# Patient Record
Sex: Female | Born: 1970 | Race: White | Hispanic: No | Marital: Married | Smoking: Never smoker
Health system: Southern US, Community
[De-identification: ages and names within clinical notes are randomized; demographics above are authoritative.]

## PROBLEM LIST (undated history)

## (undated) DIAGNOSIS — F419 Anxiety disorder, unspecified: Secondary | ICD-10-CM

## (undated) DIAGNOSIS — I10 Essential (primary) hypertension: Secondary | ICD-10-CM

## (undated) DIAGNOSIS — R7611 Nonspecific reaction to tuberculin skin test without active tuberculosis: Secondary | ICD-10-CM

## (undated) HISTORY — PX: LAPAROSCOPIC TUBAL LIGATION: SUR803

## (undated) HISTORY — PX: NOVASURE ABLATION: SHX5394

## (undated) HISTORY — DX: Nonspecific reaction to tuberculin skin test without active tuberculosis: R76.11

## (undated) HISTORY — DX: Essential (primary) hypertension: I10

## (undated) HISTORY — DX: Anxiety disorder, unspecified: F41.9

---

## 2004-05-12 ENCOUNTER — Observation Stay: Payer: Self-pay

## 2004-05-16 ENCOUNTER — Inpatient Hospital Stay: Payer: Self-pay | Admitting: Obstetrics and Gynecology

## 2004-05-20 ENCOUNTER — Ambulatory Visit: Payer: Self-pay | Admitting: Unknown Physician Specialty

## 2006-01-25 ENCOUNTER — Ambulatory Visit: Payer: Self-pay | Admitting: Obstetrics and Gynecology

## 2006-02-03 ENCOUNTER — Ambulatory Visit: Payer: Self-pay | Admitting: Obstetrics and Gynecology

## 2006-08-11 ENCOUNTER — Ambulatory Visit: Payer: Self-pay | Admitting: Obstetrics and Gynecology

## 2010-05-17 LAB — HM MAMMOGRAPHY: HM Mammogram: NORMAL

## 2010-05-27 ENCOUNTER — Ambulatory Visit: Payer: Self-pay | Admitting: Obstetrics and Gynecology

## 2011-05-11 LAB — HM PAP SMEAR: HM PAP: NEGATIVE

## 2011-05-20 ENCOUNTER — Other Ambulatory Visit (HOSPITAL_COMMUNITY)
Admission: RE | Admit: 2011-05-20 | Discharge: 2011-05-20 | Disposition: A | Discharge status: Home / Self Care | Payer: BC Managed Care – PPO | Source: Ambulatory Visit | Attending: Internal Medicine | Admitting: Internal Medicine

## 2011-05-20 ENCOUNTER — Ambulatory Visit (INDEPENDENT_AMBULATORY_CARE_PROVIDER_SITE_OTHER): Payer: BC Managed Care – PPO | Admitting: Internal Medicine

## 2011-05-20 ENCOUNTER — Encounter: Payer: Self-pay | Admitting: Internal Medicine

## 2011-05-20 DIAGNOSIS — I10 Essential (primary) hypertension: Secondary | ICD-10-CM | POA: Insufficient documentation

## 2011-05-20 DIAGNOSIS — Z0000 Encounter for general adult medical examination without abnormal findings: Secondary | ICD-10-CM

## 2011-05-20 DIAGNOSIS — L739 Follicular disorder, unspecified: Secondary | ICD-10-CM

## 2011-05-20 DIAGNOSIS — Z01419 Encounter for gynecological examination (general) (routine) without abnormal findings: Secondary | ICD-10-CM | POA: Insufficient documentation

## 2011-05-20 DIAGNOSIS — Z800 Family history of malignant neoplasm of digestive organs: Secondary | ICD-10-CM

## 2011-05-20 DIAGNOSIS — Z1159 Encounter for screening for other viral diseases: Secondary | ICD-10-CM | POA: Insufficient documentation

## 2011-05-20 DIAGNOSIS — L738 Other specified follicular disorders: Secondary | ICD-10-CM

## 2011-05-20 MED ORDER — BISOPROLOL-HYDROCHLOROTHIAZIDE 5-6.25 MG PO TABS: 1.0000 | ORAL_TABLET | Freq: Every day | ORAL | Status: DC

## 2011-05-20 MED ORDER — GENTAMICIN SULFATE 0.1 % EX OINT: TOPICAL_OINTMENT | Freq: Three times a day (TID) | CUTANEOUS | Status: AC

## 2011-05-20 NOTE — Progress Notes (Signed)
Subjective:    Patient ID: Michelle Moses, female    DOB: Apr 29, 1971, 40 y.o.   MRN: [id number]  HPI Michelle Moses is a 40 year old female who presents to establish care. She denies any complaints today. She is interested in losing weight. She reports some dietary indiscretions and a sedentary lifestyle. 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 She has 2 children. She has a history of hypertension for which she takes bisoprolol hydrochlorothiazide. She denies any problems with this medication. She notes that she was also started on Wellbutrin for anxiety. She reports good control of her anxiety with this medication.  She notes a strong family history of colon cancer. Her mother died from colon cancer at age 14. She is interested in having a screening colonoscopy. She notes that she is up-to-date on her other cancer screenings including mammogram which he had last year. She is due for Pap smear.  Outpatient Encounter Prescriptions as of 05/20/2011  Medication Sig Dispense Refill  . bisoprolol-hydrochlorothiazide (ZIAC) 5-6.25 MG per tablet Take 1 tablet by mouth daily.  90 tablet  4  . buPROPion (WELLBUTRIN SR) 150 MG 12 hr tablet Take 1 tablet by mouth Daily.      Marland Kitchen gentamicin (GARAMYCIN) 0.1 % ointment Apply topically 3 (three) times daily.  15 g  0   Review of Systems  Constitutional: Negative for fever, chills, appetite change, fatigue and unexpected weight change.  HENT: Negative for ear pain, congestion, sore throat, trouble swallowing, neck pain, voice change and sinus pressure.   Eyes: Negative for visual disturbance.  Respiratory: Negative for cough, shortness of breath, wheezing and stridor.   Cardiovascular: Negative for chest pain, palpitations and leg swelling.  Gastrointestinal: Negative for nausea, vomiting, abdominal pain, diarrhea, constipation, blood in stool, abdominal distention and anal bleeding.  Genitourinary: Negative for dysuria and flank pain.    Musculoskeletal: Negative for myalgias, arthralgias and gait problem.  Skin: Negative for color change and rash.  Neurological: Negative for dizziness and headaches.  Hematological: Negative for adenopathy. Does not bruise/bleed easily.  Psychiatric/Behavioral: Negative for suicidal ideas, sleep disturbance and dysphoric mood. The patient is not nervous/anxious.    BP 120/80  Pulse 65  Temp(Src) 98.5 F (36.9 C) (Oral)  Resp 20  Ht 5\' 6"  (1.676 m)  Wt 225 lb (102.059 kg)  BMI 36.32 kg/m2  SpO2 98%     Objective:   Physical Exam  Constitutional: She is oriented to person, place, and time. She appears well-developed and well-nourished. No distress.  HENT:  Head: Normocephalic and atraumatic.  Right Ear: External ear normal.  Left Ear: External ear normal.  Nose: Nose normal.  Mouth/Throat: Oropharynx is clear and moist. No oropharyngeal exudate.  Eyes: Conjunctivae are normal. Pupils are equal, round, and reactive to light. Right eye exhibits no discharge. Left eye exhibits no discharge. No scleral icterus.  Neck: Normal range of motion. Neck supple. No tracheal deviation present. No thyromegaly present.  Cardiovascular: Normal rate, regular rhythm, normal heart sounds and intact distal pulses.  Exam reveals no gallop and no friction rub.   No murmur heard. Pulmonary/Chest: Effort normal and breath sounds normal. No respiratory distress. She has no wheezes. She has no rales. She exhibits no tenderness. Right breast exhibits skin change. Right breast exhibits no inverted nipple, no mass and no nipple discharge. Left breast exhibits no inverted nipple, no mass, no nipple discharge and no skin change. Breasts are symmetrical.    Abdominal: Soft. Bowel sounds are normal. She exhibits  no distension and no mass. There is no tenderness. There is no rebound and no guarding.  Genitourinary: Vagina normal and uterus normal. No breast swelling, tenderness, discharge or bleeding. Pelvic exam  was performed with patient prone. There is no rash, tenderness, lesion or injury on the right labia. There is no rash, tenderness, lesion or injury on the left labia. Cervix exhibits no motion tenderness and no friability.  Musculoskeletal: Normal range of motion. She exhibits no edema and no tenderness.  Lymphadenopathy:    She has no cervical adenopathy.  Neurological: She is alert and oriented to person, place, and time. No cranial nerve deficit. She exhibits normal muscle tone. Coordination normal.  Skin: Skin is warm and dry. No rash noted. She is not diaphoretic. No erythema. No pallor.  Psychiatric: She has a normal mood and affect. Her behavior is normal. Judgment and thought content normal.          Assessment & Plan:  1.  Well exam - general exam performed today. Exam was normal. Pap smear is pending. Will check basic blood work including CBC, CMP, lipid profile, and TSH. Will try to set up a screening colonoscopy. We reviewed guidelines on this today and given that her mother died of colon cancer at age 47 she would be due for screening colonoscopy at age 15. She will followup in 6 months.  2. Hypertension -blood pressure well-controlled today. We'll continue bisoprolol hydrochlorothiazide. We'll check renal function with labs next week.  3. Obesity -patient overweight with BMI of 36. We discussed calorie counting and reducing intake to 1200 calories or less per day as well as increasing physical activity with a goal of 30 minutes most days of the week. I gave her some references to use for calorie counting. She will followup in 6 months.  4. Folliculitis - patient noted to have inflamed follicle on right breast. Will try treating with topical gentamicin ointment. If no improvement she will call or return to clinic.

## 2011-05-20 NOTE — Patient Instructions (Signed)
[employer] [facility] [email or web address]

## 2011-05-26 ENCOUNTER — Encounter: Payer: Self-pay | Admitting: Internal Medicine

## 2011-05-27 ENCOUNTER — Other Ambulatory Visit: Payer: BC Managed Care – PPO

## 2011-06-03 ENCOUNTER — Other Ambulatory Visit: Payer: BC Managed Care – PPO

## 2011-06-17 ENCOUNTER — Other Ambulatory Visit (INDEPENDENT_AMBULATORY_CARE_PROVIDER_SITE_OTHER): Payer: BC Managed Care – PPO | Admitting: *Deleted

## 2011-06-17 ENCOUNTER — Encounter: Payer: Self-pay | Admitting: Internal Medicine

## 2011-06-17 DIAGNOSIS — Z0000 Encounter for general adult medical examination without abnormal findings: Secondary | ICD-10-CM

## 2011-06-17 LAB — COMPREHENSIVE METABOLIC PANEL
BUN: 12 mg/dL (ref 6–23)
CO2: 24 mEq/L (ref 19–32)
Calcium: 9 mg/dL (ref 8.4–10.5)
Chloride: 105 mEq/L (ref 96–112)
Creatinine, Ser: 0.7 mg/dL (ref 0.4–1.2)
GFR: 96.58 mL/min (ref >60.00)
Glucose, Bld: 84 mg/dL (ref 70–99)

## 2011-06-17 LAB — CBC WITH DIFFERENTIAL/PLATELET
Basophils Relative: 0.3 % (ref 0.0–3.0)
HCT: 42.9 % (ref 36.0–46.0)
MCV: 85.5 fl (ref 78.0–100.0)
Monocytes Relative: 6.7 % (ref 3.0–12.0)
Neutro Abs: 6.7 10*3/uL (ref 1.4–7.7)
Neutrophils Relative %: 66.5 % (ref 43.0–77.0)
Platelets: 232 10*3/uL (ref 150.0–400.0)
RDW: 12.7 % (ref 11.5–14.6)
WBC: 10 10*3/uL (ref 4.5–10.5)

## 2011-06-17 LAB — LIPID PANEL
HDL: 68.5 mg/dL (ref >39.00)
Triglycerides: 95 mg/dL (ref 0.0–149.0)

## 2011-08-16 ENCOUNTER — Telehealth: Payer: Self-pay | Admitting: *Deleted

## 2011-08-16 NOTE — Telephone Encounter (Signed)
I spoke w/pt - she c/o swelling around her left eye x 3 days. I offered 9 am open apt w/Dr Darrick Huntsman today. She declined b/c she could not get off work and stated she would go to [facility] this afternoon.

## 2011-11-18 ENCOUNTER — Ambulatory Visit: Payer: BC Managed Care – PPO | Admitting: Internal Medicine

## 2012-07-20 ENCOUNTER — Encounter: Payer: BC Managed Care – PPO | Admitting: Internal Medicine

## 2012-07-20 ENCOUNTER — Encounter: Payer: Self-pay | Admitting: Internal Medicine

## 2012-07-20 ENCOUNTER — Ambulatory Visit (INDEPENDENT_AMBULATORY_CARE_PROVIDER_SITE_OTHER): Payer: BC Managed Care – PPO | Admitting: Internal Medicine

## 2012-07-20 VITALS — BP 124/84 | HR 75 | Temp 98.0°F | Resp 16 | Ht 66.0 in | Wt 236.5 lb

## 2012-07-20 DIAGNOSIS — Z0000 Encounter for general adult medical examination without abnormal findings: Secondary | ICD-10-CM | POA: Insufficient documentation

## 2012-07-20 DIAGNOSIS — E669 Obesity, unspecified: Secondary | ICD-10-CM | POA: Insufficient documentation

## 2012-07-20 DIAGNOSIS — I10 Essential (primary) hypertension: Secondary | ICD-10-CM

## 2012-07-20 DIAGNOSIS — Z1331 Encounter for screening for depression: Secondary | ICD-10-CM

## 2012-07-20 DIAGNOSIS — Z23 Encounter for immunization: Secondary | ICD-10-CM

## 2012-07-20 LAB — LIPID PANEL: Cholesterol: 171 mg/dL (ref 0–200)

## 2012-07-20 LAB — COMPREHENSIVE METABOLIC PANEL
Albumin: 3.7 g/dL (ref 3.5–5.2)
BUN: 11 mg/dL (ref 6–23)
Calcium: 9 mg/dL (ref 8.4–10.5)
Chloride: 105 mEq/L (ref 96–112)
Glucose, Bld: 147 mg/dL — ABNORMAL HIGH (ref 70–99)
Potassium: 3.4 mEq/L — ABNORMAL LOW (ref 3.5–5.1)

## 2012-07-20 LAB — CBC WITH DIFFERENTIAL/PLATELET
Basophils Absolute: 0 10*3/uL (ref 0.0–0.1)
Lymphocytes Relative: 23.6 % (ref 12.0–46.0)
Lymphs Abs: 2.9 10*3/uL (ref 0.7–4.0)
Monocytes Relative: 6.1 % (ref 3.0–12.0)
Platelets: 202 10*3/uL (ref 150.0–400.0)
RDW: 13 % (ref 11.5–14.6)

## 2012-07-20 LAB — MICROALBUMIN / CREATININE URINE RATIO: Microalb, Ur: 0.6 mg/dL (ref 0.0–1.9)

## 2012-07-20 LAB — TSH: TSH: 0.37 u[IU]/mL (ref 0.35–5.50)

## 2012-07-20 MED ORDER — BISOPROLOL-HYDROCHLOROTHIAZIDE 5-6.25 MG PO TABS: 1.0000 | ORAL_TABLET | Freq: Every day | ORAL | Status: DC

## 2012-07-20 NOTE — Assessment & Plan Note (Signed)
BMI 38. Encouraged patient to work on weight loss. Encouraged Mediterranean style diet and regular physical activity with goal of 40 minutes of exercise 3 days per week.

## 2012-07-20 NOTE — Assessment & Plan Note (Signed)
Blood pressure well-controlled on current medications. Will check renal function with labs today. Followup in 6 months or sooner as needed.

## 2012-07-20 NOTE — Assessment & Plan Note (Signed)
General medical exam including breast exam is normal today. Pap is deferred as this was normal in 2011. Plan to repeat in 2016. Mammogram ordered today. Patient never followed through with screening colonoscopy which was ordered at last visit. She would prefer to defer colonoscopy until 2014. She will call and she would like to schedule. Flu vaccine given today. Will check labs including CBC, CMP, lipid profile.

## 2012-07-20 NOTE — Progress Notes (Signed)
Subjective:    Patient ID: Michelle Moses, female    DOB: [birth year], 41 y.o.   MRN: [id number]  HPI 41 year old female with history of hypertension and family history of colon cancer presents for annual exam. She reports she is generally doing well. She has no new concerns today. She notes that she has gained weight over the last year. She reports some dietary indiscretion. She does not exercise on a regular basis. In regards to hypertension, she reports compliance with her medication. She denies any headache, palpitations, chest pain. In regards to family history of colon cancer, she reports that she never followed through on colonoscopy which was scheduled after her last visit. She would like to defer until 2014 for screening colonoscopy.  Outpatient Encounter Prescriptions as of 07/20/2012  Medication Sig Dispense Refill  . bisoprolol-hydrochlorothiazide (ZIAC) 5-6.25 MG per tablet Take 1 tablet by mouth daily.  90 tablet  4   BP 124/84  Pulse 75  Temp 98 F (36.7 C) (Oral)  Resp 16  Ht 5\' 6"  (1.676 m)  Wt 236 lb 8 oz (107.276 kg)  BMI 38.17 kg/m2  SpO2 97%  Review of Systems  Constitutional: Negative for fever, chills, appetite change, fatigue and unexpected weight change.  HENT: Negative for ear pain, congestion, sore throat, trouble swallowing, neck pain, voice change and sinus pressure.   Eyes: Negative for visual disturbance.  Respiratory: Negative for cough, shortness of breath, wheezing and stridor.   Cardiovascular: Negative for chest pain, palpitations and leg swelling.  Gastrointestinal: Negative for nausea, vomiting, abdominal pain, diarrhea, constipation, blood in stool, abdominal distention and anal bleeding.  Genitourinary: Negative for dysuria and flank pain.  Musculoskeletal: Negative for myalgias, arthralgias and gait problem.  Skin: Negative for color change and rash.  Neurological: Negative for dizziness and headaches.  Hematological: Negative for adenopathy. Does  not bruise/bleed easily.  Psychiatric/Behavioral: Negative for suicidal ideas, sleep disturbance and dysphoric mood. The patient is not nervous/anxious.        Objective:   Physical Exam  Constitutional: She is oriented to person, place, and time. She appears well-developed and well-nourished. No distress.  HENT:  Head: Normocephalic and atraumatic.  Right Ear: External ear normal.  Left Ear: External ear normal.  Nose: Nose normal.  Mouth/Throat: Oropharynx is clear and moist. No oropharyngeal exudate.  Eyes: Conjunctivae normal are normal. Pupils are equal, round, and reactive to light. Right eye exhibits no discharge. Left eye exhibits no discharge. No scleral icterus.  Neck: Normal range of motion. Neck supple. No tracheal deviation present. No thyromegaly present.  Cardiovascular: Normal rate, regular rhythm, normal heart sounds and intact distal pulses.  Exam reveals no gallop and no friction rub.   No murmur heard. Pulmonary/Chest: Effort normal and breath sounds normal. No accessory muscle usage. Not tachypneic. No respiratory distress. She has no decreased breath sounds. She has no wheezes. She has no rhonchi. She has no rales. She exhibits no tenderness. Right breast exhibits no inverted nipple, no mass, no nipple discharge, no skin change and no tenderness. Left breast exhibits no inverted nipple, no mass, no nipple discharge, no skin change and no tenderness. Breasts are symmetrical.  Abdominal: Soft. Bowel sounds are normal. She exhibits no distension. There is no tenderness. There is no rebound.  Musculoskeletal: Normal range of motion. She exhibits no edema and no tenderness.  Lymphadenopathy:    She has no cervical adenopathy.  Neurological: She is alert and oriented to person, place, and time. No cranial nerve deficit. She  exhibits normal muscle tone. Coordination normal.  Skin: Skin is warm and dry. No rash noted. She is not diaphoretic. No erythema. No pallor.   Psychiatric: She has a normal mood and affect. Her behavior is normal. Judgment and thought content normal.          Assessment & Plan:

## 2012-07-21 LAB — VITAMIN D 25 HYDROXY (VIT D DEFICIENCY, FRACTURES): Vit D, 25-Hydroxy: 22 ng/mL — ABNORMAL LOW (ref 30–89)

## 2012-08-21 ENCOUNTER — Ambulatory Visit: Payer: Self-pay | Admitting: Internal Medicine

## 2012-08-31 ENCOUNTER — Encounter: Payer: Self-pay | Admitting: Internal Medicine

## 2013-03-29 ENCOUNTER — Ambulatory Visit: Payer: BC Managed Care – PPO | Admitting: Internal Medicine

## 2013-09-12 ENCOUNTER — Telehealth: Payer: Self-pay | Admitting: Internal Medicine

## 2013-09-12 DIAGNOSIS — I10 Essential (primary) hypertension: Secondary | ICD-10-CM

## 2013-09-12 NOTE — Telephone Encounter (Signed)
Pt called to schedule physical.  Needed a Friday, scheduled 4/3.  States she will run out of her BP medication before then.  Asking if she can get about a 30 day refill to get her through to her appt.

## 2013-09-13 MED ORDER — BISOPROLOL-HYDROCHLOROTHIAZIDE 5-6.25 MG PO TABS: 1.0000 | ORAL_TABLET | Freq: Every day | ORAL | Status: DC

## 2013-09-13 NOTE — Telephone Encounter (Signed)
Patient was last seen on 07/20/2012 for physical, cancelled her appointment on 03/29/2013. Please advise for refills.

## 2013-09-13 NOTE — Telephone Encounter (Signed)
Prescription sent to pharmacy.

## 2013-09-13 NOTE — Telephone Encounter (Signed)
We can call in 1 month supply, then needs to keep appointment.

## 2013-11-01 ENCOUNTER — Encounter: Payer: BC Managed Care – PPO | Admitting: Internal Medicine

## 2013-11-08 ENCOUNTER — Ambulatory Visit (INDEPENDENT_AMBULATORY_CARE_PROVIDER_SITE_OTHER): Payer: BC Managed Care – PPO | Admitting: Internal Medicine

## 2013-11-08 ENCOUNTER — Encounter: Payer: Self-pay | Admitting: Internal Medicine

## 2013-11-08 VITALS — BP 120/80 | HR 77 | Temp 97.7°F | Ht 65.5 in | Wt 241.0 lb

## 2013-11-08 DIAGNOSIS — Z800 Family history of malignant neoplasm of digestive organs: Secondary | ICD-10-CM | POA: Insufficient documentation

## 2013-11-08 DIAGNOSIS — Z1211 Encounter for screening for malignant neoplasm of colon: Secondary | ICD-10-CM

## 2013-11-08 DIAGNOSIS — Z0000 Encounter for general adult medical examination without abnormal findings: Secondary | ICD-10-CM

## 2013-11-08 DIAGNOSIS — Z1239 Encounter for other screening for malignant neoplasm of breast: Secondary | ICD-10-CM | POA: Insufficient documentation

## 2013-11-08 DIAGNOSIS — I10 Essential (primary) hypertension: Secondary | ICD-10-CM

## 2013-11-08 LAB — COMPREHENSIVE METABOLIC PANEL
ALBUMIN: 4 g/dL (ref 3.5–5.2)
ALT: 15 U/L (ref 0–35)
AST: 22 U/L (ref 0–37)
Alkaline Phosphatase: 87 U/L (ref 39–117)
BUN: 8 mg/dL (ref 6–23)
CALCIUM: 9.4 mg/dL (ref 8.4–10.5)
CHLORIDE: 100 meq/L (ref 96–112)
CO2: 28 meq/L (ref 19–32)
CREATININE: 0.7 mg/dL (ref 0.4–1.2)
GFR: 98.66 mL/min (ref >60.00)
Glucose, Bld: 107 mg/dL — ABNORMAL HIGH (ref 70–99)
POTASSIUM: 3.6 meq/L (ref 3.5–5.1)
Sodium: 137 mEq/L (ref 135–145)
Total Bilirubin: 0.7 mg/dL (ref 0.3–1.2)
Total Protein: 7.4 g/dL (ref 6.0–8.3)

## 2013-11-08 LAB — CBC WITH DIFFERENTIAL/PLATELET
BASOS ABS: 0.1 10*3/uL (ref 0.0–0.1)
BASOS PCT: 0.6 % (ref 0.0–3.0)
Eosinophils Absolute: 0.1 10*3/uL (ref 0.0–0.7)
Eosinophils Relative: 1.1 % (ref 0.0–5.0)
HCT: 40.4 % (ref 36.0–46.0)
HEMOGLOBIN: 13.6 g/dL (ref 12.0–15.0)
LYMPHS PCT: 28.2 % (ref 12.0–46.0)
Lymphs Abs: 3.2 10*3/uL (ref 0.7–4.0)
MCHC: 33.7 g/dL (ref 30.0–36.0)
MCV: 84 fl (ref 78.0–100.0)
MONOS PCT: 5.6 % (ref 3.0–12.0)
Monocytes Absolute: 0.6 10*3/uL (ref 0.1–1.0)
NEUTROS ABS: 7.3 10*3/uL (ref 1.4–7.7)
Neutrophils Relative %: 64.5 % (ref 43.0–77.0)
Platelets: 259 10*3/uL (ref 150.0–400.0)
RBC: 4.8 Mil/uL (ref 3.87–5.11)
RDW: 13.1 % (ref 11.5–14.6)
WBC: 11.3 10*3/uL — AB (ref 4.5–10.5)

## 2013-11-08 LAB — MICROALBUMIN / CREATININE URINE RATIO
Creatinine,U: 105.9 mg/dL
MICROALB UR: 1.2 mg/dL (ref 0.0–1.9)
MICROALB/CREAT RATIO: 1.1 mg/g (ref 0.0–30.0)

## 2013-11-08 LAB — LIPID PANEL
Cholesterol: 157 mg/dL (ref 0–200)
HDL: 56.7 mg/dL (ref >39.00)
LDL Cholesterol: 76 mg/dL (ref 0–99)
Total CHOL/HDL Ratio: 3
Triglycerides: 122 mg/dL (ref 0.0–149.0)
VLDL: 24.4 mg/dL (ref 0.0–40.0)

## 2013-11-08 MED ORDER — BISOPROLOL-HYDROCHLOROTHIAZIDE 5-6.25 MG PO TABS: 1.0000 | ORAL_TABLET | Freq: Every day | ORAL | Status: DC

## 2013-11-08 NOTE — Assessment & Plan Note (Signed)
BP Readings from Last 3 Encounters:  11/08/13 120/80  07/20/12 124/84  05/20/11 120/80   BP well controlled on current medications. Will check renal function with labs today.

## 2013-11-08 NOTE — Assessment & Plan Note (Signed)
Mammogram ordered

## 2013-11-08 NOTE — Assessment & Plan Note (Signed)
Colonoscopy ordered.

## 2013-11-08 NOTE — Assessment & Plan Note (Signed)
General medical exam normal today including breast exam. PAP and pelvic deferred per pt preference. Last PAP 05/2011 normal, HPV neg. Plan repeat PAP 2016. Will check labs including CBC, CMP, lipids. Mammogram ordered. Colonoscopy ordered given family h/o colon cancer. Follow up 6-12 months and prn.

## 2013-11-08 NOTE — Progress Notes (Signed)
Subjective:    Patient ID: Michelle Moses, female    DOB: 12-22-1970, 43 y.o.   MRN: [id number]  HPI 43YO female presents for annual exam. Doing well. No concerns today. Compliant with meds. Has not yet set up colonoscopy. Last mammogram 2014. Last PAP 05/2011 normal, HPV neg. Trying to follow healthier diet and planning to increase exercise.  Review of Systems  Constitutional: Negative for fever, chills, appetite change, fatigue and unexpected weight change.  HENT: Negative for congestion, ear pain, sinus pressure, sore throat, trouble swallowing and voice change.   Eyes: Negative for visual disturbance.  Respiratory: Negative for cough, shortness of breath, wheezing and stridor.   Cardiovascular: Negative for chest pain, palpitations and leg swelling.  Gastrointestinal: Negative for nausea, vomiting, abdominal pain, diarrhea, constipation, blood in stool, abdominal distention and anal bleeding.  Genitourinary: Negative for dysuria and flank pain.  Musculoskeletal: Negative for arthralgias, gait problem, myalgias and neck pain.  Skin: Negative for color change and rash.  Neurological: Negative for dizziness and headaches.  Hematological: Negative for adenopathy. Does not bruise/bleed easily.  Psychiatric/Behavioral: Negative for suicidal ideas, sleep disturbance and dysphoric mood. The patient is not nervous/anxious.        Objective:    BP 120/80  Pulse 77  Temp(Src) 97.7 F (36.5 C) (Oral)  Ht 5' 5.5" (1.664 m)  Wt 241 lb (109.317 kg)  BMI 39.48 kg/m2  SpO2 97% Physical Exam  Constitutional: She is oriented to person, place, and time. She appears well-developed and well-nourished. No distress.  HENT:  Head: Normocephalic and atraumatic.  Right Ear: External ear normal.  Left Ear: External ear normal.  Nose: Nose normal.  Mouth/Throat: Oropharynx is clear and moist. No oropharyngeal exudate.  Eyes: Conjunctivae are normal. Pupils are equal, round, and reactive to light.  Right eye exhibits no discharge. Left eye exhibits no discharge. No scleral icterus.  Neck: Normal range of motion. Neck supple. No tracheal deviation present. No thyromegaly present.  Cardiovascular: Normal rate, regular rhythm, normal heart sounds and intact distal pulses.  Exam reveals no gallop and no friction rub.   No murmur heard. Pulmonary/Chest: Effort normal and breath sounds normal. No accessory muscle usage. Not tachypneic. No respiratory distress. She has no decreased breath sounds. She has no wheezes. She has no rhonchi. She has no rales. She exhibits no tenderness. Right breast exhibits no inverted nipple, no mass, no nipple discharge, no skin change and no tenderness. Left breast exhibits no inverted nipple, no mass, no nipple discharge, no skin change and no tenderness. Breasts are symmetrical.  Abdominal: Soft. Bowel sounds are normal. She exhibits no distension and no mass. There is no tenderness. There is no rebound and no guarding.  Musculoskeletal: Normal range of motion. She exhibits no edema and no tenderness.  Lymphadenopathy:    She has no cervical adenopathy.  Neurological: She is alert and oriented to person, place, and time. No cranial nerve deficit. She exhibits normal muscle tone. Coordination normal.  Skin: Skin is warm and dry. No rash noted. She is not diaphoretic. No erythema. No pallor.  Psychiatric: She has a normal mood and affect. Her behavior is normal. Judgment and thought content normal.          Assessment & Plan:   Problem List Items Addressed This Visit   Family hx of colon cancer     Colonoscopy ordered.    Relevant Orders      Ambulatory referral to Colorectal Surgery   Hypertension  BP Readings from Last 3 Encounters:  11/08/13 120/80  07/20/12 124/84  05/20/11 120/80   BP well controlled on current medications. Will check renal function with labs today.    Relevant Medications      bisoprolol-hydrochlorothiazide (ZIAC) 5-6.25 MG  per tablet   Routine general medical examination at a health care facility - Primary     General medical exam normal today including breast exam. PAP and pelvic deferred per pt preference. Last PAP 05/2011 normal, HPV neg. Plan repeat PAP 2016. Will check labs including CBC, CMP, lipids. Mammogram ordered. Colonoscopy ordered given family h/o colon cancer. Follow up 6-12 months and prn.    Relevant Orders      CBC with Differential      Comprehensive metabolic panel      Lipid panel      Microalbumin / creatinine urine ratio      Vit D  25 hydroxy (rtn osteoporosis monitoring)   Screening for breast cancer     Mammogram ordered.    Relevant Orders      MM Digital Screening   Special screening for malignant neoplasms, colon     Colonoscopy ordered.    Relevant Orders      Ambulatory referral to Colorectal Surgery       Return in about 1 year (around 11/09/2014) for Physical.

## 2013-11-09 LAB — VITAMIN D 25 HYDROXY (VIT D DEFICIENCY, FRACTURES): VIT D 25 HYDROXY: 24 ng/mL — AB (ref 30–89)

## 2013-11-10 ENCOUNTER — Encounter: Payer: Self-pay | Admitting: *Deleted

## 2013-11-11 ENCOUNTER — Encounter: Payer: Self-pay | Admitting: *Deleted

## 2013-11-11 ENCOUNTER — Telehealth: Payer: Self-pay | Admitting: Internal Medicine

## 2013-11-11 NOTE — Telephone Encounter (Signed)
Relevant patient education assigned to patient using Emmi. ° °

## 2013-11-12 ENCOUNTER — Encounter: Payer: Self-pay | Admitting: Emergency Medicine

## 2013-11-29 ENCOUNTER — Ambulatory Visit: Payer: Self-pay | Admitting: Internal Medicine

## 2013-11-29 LAB — HM MAMMOGRAPHY: HM Mammogram: NEGATIVE

## 2013-12-03 ENCOUNTER — Encounter: Payer: Self-pay | Admitting: *Deleted

## 2014-02-12 LAB — HM COLONOSCOPY

## 2014-04-04 ENCOUNTER — Ambulatory Visit (INDEPENDENT_AMBULATORY_CARE_PROVIDER_SITE_OTHER): Payer: BC Managed Care – PPO | Admitting: Internal Medicine

## 2014-04-04 ENCOUNTER — Ambulatory Visit: Payer: Self-pay | Admitting: Internal Medicine

## 2014-04-04 ENCOUNTER — Encounter: Payer: Self-pay | Admitting: Internal Medicine

## 2014-04-04 VITALS — BP 126/80 | HR 56 | Temp 97.8°F | Ht 65.5 in

## 2014-04-04 DIAGNOSIS — Z0000 Encounter for general adult medical examination without abnormal findings: Secondary | ICD-10-CM

## 2014-04-04 DIAGNOSIS — R109 Unspecified abdominal pain: Secondary | ICD-10-CM

## 2014-04-04 LAB — POCT URINALYSIS DIPSTICK
BILIRUBIN UA: NEGATIVE
Glucose, UA: NEGATIVE
KETONES UA: NEGATIVE
Leukocytes, UA: NEGATIVE
Nitrite, UA: NEGATIVE
Protein, UA: NEGATIVE
Urobilinogen, UA: 0.2
pH, UA: 6.5

## 2014-04-04 MED ORDER — HYDROCODONE-ACETAMINOPHEN 5-325 MG PO TABS: 1.0000 | ORAL_TABLET | Freq: Three times a day (TID) | ORAL | Status: DC | PRN

## 2014-04-04 NOTE — Progress Notes (Signed)
   Subjective:    Patient ID: Michelle Moses, female    DOB: 1970/09/22, 43 y.o.   MRN: [id number]  HPI 43YO female presents for acute visit.  Abdominal pain - Tuesday night developed severe left lateral abdominal pain. Woke from sleep. Pain comes and goes. No NVD. No change in appetite. No urinary symptoms. No fever, chills. No trauma. No h/o kidney stone. Taking Motrin with some improvement in pain symptoms.  Review of Systems  Constitutional: Negative for fever, chills, appetite change, fatigue and unexpected weight change.  Eyes: Negative for visual disturbance.  Respiratory: Negative for shortness of breath.   Cardiovascular: Negative for chest pain and leg swelling.  Gastrointestinal: Positive for abdominal pain. Negative for nausea, vomiting, diarrhea, constipation and blood in stool.  Genitourinary: Positive for flank pain. Negative for dysuria, urgency, frequency, hematuria, decreased urine volume, vaginal bleeding and vaginal discharge.  Skin: Negative for color change and rash.  Hematological: Negative for adenopathy. Does not bruise/bleed easily.  Psychiatric/Behavioral: Negative for dysphoric mood. The patient is not nervous/anxious.        Objective:    BP 126/80  Pulse 56  Temp(Src) 97.8 F (36.6 C) (Oral)  Ht 5' 5.5" (1.664 m)  SpO2 98% Physical Exam  Constitutional: She is oriented to person, place, and time. She appears well-developed and well-nourished. No distress.  HENT:  Head: Normocephalic and atraumatic.  Right Ear: External ear normal.  Left Ear: External ear normal.  Nose: Nose normal.  Mouth/Throat: Oropharynx is clear and moist.  Eyes: Conjunctivae are normal. Pupils are equal, round, and reactive to light. Right eye exhibits no discharge. Left eye exhibits no discharge. No scleral icterus.  Neck: Normal range of motion. Neck supple. No tracheal deviation present. No thyromegaly present.  Pulmonary/Chest: Effort normal. No accessory muscle usage. Not  tachypneic. She has no decreased breath sounds. She has no rhonchi.  Abdominal: There is tenderness (left flank tenderness).  Musculoskeletal: Normal range of motion. She exhibits no edema and no tenderness.  Lymphadenopathy:    She has no cervical adenopathy.  Neurological: She is alert and oriented to person, place, and time. No cranial nerve deficit. She exhibits normal muscle tone. Coordination normal.  Skin: Skin is warm and dry. No rash noted. She is not diaphoretic. No erythema. No pallor.  Psychiatric: She has a normal mood and affect. Her behavior is normal. Judgment and thought content normal.          Assessment & Plan:   Problem List Items Addressed This Visit     Unprioritized   Flank pain - Primary     Left flank pain. UA pos for blood. Will get CT abdomen to evaluate for nephrolithiasis.    Relevant Medications      HYDROcodone-acetaminophen (NORCO/VICODIN) 5-325 MG per tablet   Other Relevant Orders      POCT Urinalysis Dipstick (Completed)      CT Abdomen Pelvis Wo Contrast       Return in about 1 week (around 04/11/2014), or if symptoms worsen or fail to improve.

## 2014-04-04 NOTE — Assessment & Plan Note (Signed)
Left flank pain. UA pos for blood. Will get CT abdomen to evaluate for nephrolithiasis.

## 2014-04-04 NOTE — Addendum Note (Signed)
Addended by: Cristela Blue on: 04/04/2014 10:54 AM   Modules accepted: Orders

## 2014-04-04 NOTE — Patient Instructions (Signed)
CT abdomen today.  Increase fluid intake as much as possible.  Use Motrin and Hydrocodone as needed for pain.

## 2014-04-04 NOTE — Progress Notes (Signed)
Pre visit review using our clinic review tool, if applicable. No additional management support is needed unless otherwise documented below in the visit note. 

## 2014-04-08 ENCOUNTER — Telehealth: Payer: Self-pay | Admitting: Internal Medicine

## 2014-04-08 LAB — CULTURE, URINE COMPREHENSIVE

## 2014-04-08 NOTE — Telephone Encounter (Signed)
CT abdomen was normal with no kidney stone. Is she still having pain?

## 2014-04-09 ENCOUNTER — Other Ambulatory Visit: Payer: Self-pay | Admitting: *Deleted

## 2014-04-09 MED ORDER — CIPROFLOXACIN HCL 500 MG PO TABS: 500.0000 mg | ORAL_TABLET | Freq: Two times a day (BID) | ORAL | Status: DC

## 2014-04-11 ENCOUNTER — Ambulatory Visit: Payer: BC Managed Care – PPO | Admitting: Internal Medicine

## 2014-04-11 NOTE — Telephone Encounter (Signed)
Notified pt, Pt states no pain, antibiotic is working well

## 2014-05-14 ENCOUNTER — Encounter: Payer: Self-pay | Admitting: Internal Medicine

## 2014-11-14 ENCOUNTER — Encounter: Payer: Self-pay | Admitting: Internal Medicine

## 2014-11-14 ENCOUNTER — Ambulatory Visit (INDEPENDENT_AMBULATORY_CARE_PROVIDER_SITE_OTHER): Payer: BC Managed Care – PPO | Admitting: Internal Medicine

## 2014-11-14 VITALS — BP 115/69 | HR 51 | Temp 97.4°F | Ht 66.0 in | Wt 244.0 lb

## 2014-11-14 DIAGNOSIS — E669 Obesity, unspecified: Secondary | ICD-10-CM

## 2014-11-14 DIAGNOSIS — I10 Essential (primary) hypertension: Secondary | ICD-10-CM | POA: Diagnosis not present

## 2014-11-14 DIAGNOSIS — Z0000 Encounter for general adult medical examination without abnormal findings: Secondary | ICD-10-CM

## 2014-11-14 LAB — COMPREHENSIVE METABOLIC PANEL WITH GFR
ALT: 10 U/L (ref 0–35)
AST: 15 U/L (ref 0–37)
Albumin: 4.1 g/dL (ref 3.5–5.2)
Alkaline Phosphatase: 103 U/L (ref 39–117)
BUN: 11 mg/dL (ref 6–23)
CO2: 27 meq/L (ref 19–32)
Calcium: 9.6 mg/dL (ref 8.4–10.5)
Chloride: 102 meq/L (ref 96–112)
Creatinine, Ser: 0.66 mg/dL (ref 0.40–1.20)
GFR: 103.37 mL/min
Glucose, Bld: 116 mg/dL — ABNORMAL HIGH (ref 70–99)
Potassium: 4 meq/L (ref 3.5–5.1)
Sodium: 136 meq/L (ref 135–145)
Total Bilirubin: 0.5 mg/dL (ref 0.2–1.2)
Total Protein: 7.5 g/dL (ref 6.0–8.3)

## 2014-11-14 LAB — POCT URINALYSIS DIPSTICK
BILIRUBIN UA: NEGATIVE
Glucose, UA: NEGATIVE
KETONES UA: NEGATIVE
LEUKOCYTES UA: NEGATIVE
Nitrite, UA: NEGATIVE
Protein, UA: NEGATIVE
Urobilinogen, UA: 0.2
pH, UA: 6

## 2014-11-14 LAB — CBC WITH DIFFERENTIAL/PLATELET
BASOS PCT: 0.4 % (ref 0.0–3.0)
Basophils Absolute: 0 10*3/uL (ref 0.0–0.1)
EOS ABS: 0.1 10*3/uL (ref 0.0–0.7)
EOS PCT: 1.4 % (ref 0.0–5.0)
HCT: 42.3 % (ref 36.0–46.0)
Hemoglobin: 14.3 g/dL (ref 12.0–15.0)
Lymphocytes Relative: 25.2 % (ref 12.0–46.0)
Lymphs Abs: 2.5 10*3/uL (ref 0.7–4.0)
MCHC: 33.8 g/dL (ref 30.0–36.0)
MCV: 82.1 fl (ref 78.0–100.0)
Monocytes Absolute: 0.7 10*3/uL (ref 0.1–1.0)
Monocytes Relative: 6.6 % (ref 3.0–12.0)
NEUTROS PCT: 66.4 % (ref 43.0–77.0)
Neutro Abs: 6.7 10*3/uL (ref 1.4–7.7)
Platelets: 227 10*3/uL (ref 150.0–400.0)
RBC: 5.15 Mil/uL — AB (ref 3.87–5.11)
RDW: 13.5 % (ref 11.5–15.5)
WBC: 10.1 10*3/uL (ref 4.0–10.5)

## 2014-11-14 LAB — LIPID PANEL
Cholesterol: 177 mg/dL (ref 0–200)
HDL: 63.6 mg/dL
LDL Cholesterol: 79 mg/dL (ref 0–99)
NonHDL: 113.4
Total CHOL/HDL Ratio: 3
Triglycerides: 170 mg/dL — ABNORMAL HIGH (ref 0.0–149.0)
VLDL: 34 mg/dL (ref 0.0–40.0)

## 2014-11-14 LAB — TSH: TSH: 1.77 u[IU]/mL (ref 0.35–4.50)

## 2014-11-14 LAB — MICROALBUMIN / CREATININE URINE RATIO
CREATININE, U: 34 mg/dL
MICROALB/CREAT RATIO: 2.1 mg/g (ref 0.0–30.0)
Microalb, Ur: <0.7 mg/dL (ref 0.0–1.9)

## 2014-11-14 LAB — VITAMIN D 25 HYDROXY (VIT D DEFICIENCY, FRACTURES): VITD: 16.88 ng/mL — ABNORMAL LOW (ref 30.00–100.00)

## 2014-11-14 LAB — HEMOGLOBIN A1C: Hgb A1c MFr Bld: 6 % (ref 4.6–6.5)

## 2014-11-14 MED ORDER — BISOPROLOL-HYDROCHLOROTHIAZIDE 5-6.25 MG PO TABS: 1.0000 | ORAL_TABLET | Freq: Every day | ORAL | Status: DC

## 2014-11-14 NOTE — Assessment & Plan Note (Signed)
General medical exam normal today including breast exam. Mammogram ordered. Colonoscopy UTD, will request record on this. Declined flu vaccine. Labs today as ordered. Encouraged healthy diet and exercise.

## 2014-11-14 NOTE — Assessment & Plan Note (Signed)
BP Readings from Last 3 Encounters:  11/14/14 115/69  04/04/14 126/80  11/08/13 120/80   BP well controlled on Ziac. Renal function with labs.

## 2014-11-14 NOTE — Progress Notes (Signed)
Subjective:    Patient ID: Michelle Moses, female    DOB: 1971/06/10, 44 y.o.   MRN: [id number]  HPI  44YO female presents for annual exam.  Feeling well. Only concern today is occasional burning with urination. Nothing persistent. No fever, chills, flank pain. Due for mammogram. Had colonoscopy last year which was normal by her report.  BP Readings from Last 3 Encounters:  11/14/14 115/69  04/04/14 126/80  11/08/13 120/80   Wt Readings from Last 3 Encounters:  11/14/14 244 lb (110.678 kg)  11/08/13 241 lb (109.317 kg)  07/20/12 236 lb 8 oz (107.276 kg)     Past medical, surgical, family and social history per today's encounter.  Review of Systems  Constitutional: Negative for fever, chills, appetite change, fatigue and unexpected weight change.  Eyes: Negative for visual disturbance.  Respiratory: Negative for shortness of breath.   Cardiovascular: Negative for chest pain and leg swelling.  Gastrointestinal: Negative for nausea, vomiting, abdominal pain, diarrhea and constipation.  Musculoskeletal: Negative for myalgias and arthralgias.  Skin: Negative for color change and rash.  Hematological: Negative for adenopathy. Does not bruise/bleed easily.  Psychiatric/Behavioral: Negative for sleep disturbance and dysphoric mood. The patient is not nervous/anxious.        Objective:    BP 115/69 mmHg  Pulse 51  Temp(Src) 97.4 F (36.3 C) (Oral)  Ht  (1.676 m)  Wt 244 lb (110.678 kg)  BMI 39.40 kg/m2  SpO2 99% Physical Exam  Constitutional: She is oriented to person, place, and time. She appears well-developed and well-nourished. No distress.  HENT:  Head: Normocephalic and atraumatic.  Right Ear: External ear normal.  Left Ear: External ear normal.  Nose: Nose normal.  Mouth/Throat: Oropharynx is clear and moist. No oropharyngeal exudate.  Eyes: Conjunctivae are normal. Pupils are equal, round, and reactive to light. Right eye exhibits no discharge. Left  eye exhibits no discharge. No scleral icterus.  Neck: Normal range of motion. Neck supple. No tracheal deviation present. No thyromegaly present.  Cardiovascular: Normal rate, regular rhythm, normal heart sounds and intact distal pulses.  Exam reveals no gallop and no friction rub.   No murmur heard. Pulmonary/Chest: Effort normal and breath sounds normal. No accessory muscle usage. No tachypnea. No respiratory distress. She has no decreased breath sounds. She has no wheezes. She has no rales. She exhibits no tenderness. Right breast exhibits no inverted nipple, no mass, no nipple discharge, no skin change and no tenderness. Left breast exhibits no inverted nipple, no mass, no nipple discharge, no skin change and no tenderness. Breasts are symmetrical.  Abdominal: Soft. Bowel sounds are normal. She exhibits no distension and no mass. There is no tenderness. There is no rebound and no guarding.  Musculoskeletal: Normal range of motion. She exhibits no edema or tenderness.  Lymphadenopathy:    She has no cervical adenopathy.  Neurological: She is alert and oriented to person, place, and time. No cranial nerve deficit. She exhibits normal muscle tone. Coordination normal.  Skin: Skin is warm and dry. No rash noted. She is not diaphoretic. No erythema. No pallor.  Psychiatric: She has a normal mood and affect. Her behavior is normal. Judgment and thought content normal.          Assessment & Plan:   Problem List Items Addressed This Visit      Unprioritized   Hypertension    BP Readings from Last 3 Encounters:  11/14/14 115/69  04/04/14 126/80  11/08/13 120/80   BP  well controlled on Ziac. Renal function with labs.      Relevant Medications   bisoprolol-hydrochlorothiazide (ZIAC) 5-6.25 MG per tablet   Obesity (BMI 30-39.9)    Wt Readings from Last 3 Encounters:  11/14/14 244 lb (110.678 kg)  11/08/13 241 lb (109.317 kg)  07/20/12 236 lb 8 oz (107.276 kg)   Body mass index is 39.4  kg/(m^2). Encouraged healthy diet and exercise. Handout given.      Routine general medical examination at a health care facility - Primary    General medical exam normal today including breast exam. Mammogram ordered. Colonoscopy UTD, will request record on this. Declined flu vaccine. Labs today as ordered. Encouraged healthy diet and exercise.      Relevant Orders   POCT urinalysis dipstick   CBC with Differential/Platelet   Comprehensive metabolic panel   Lipid panel   Microalbumin / creatinine urine ratio   Vit D  25 hydroxy (rtn osteoporosis monitoring)   TSH   Hemoglobin A1c   MM Digital Screening       Return in about 6 months (around 05/16/2015) for Recheck.

## 2014-11-14 NOTE — Progress Notes (Signed)
Pre visit review using our clinic review tool, if applicable. No additional management support is needed unless otherwise documented below in the visit note. 

## 2014-11-14 NOTE — Patient Instructions (Signed)

## 2014-11-14 NOTE — Assessment & Plan Note (Signed)
Wt Readings from Last 3 Encounters:  11/14/14 244 lb (110.678 kg)  11/08/13 241 lb (109.317 kg)  07/20/12 236 lb 8 oz (107.276 kg)   Body mass index is 39.4 kg/(m^2). Encouraged healthy diet and exercise. Handout given.

## 2014-11-18 ENCOUNTER — Encounter: Payer: Self-pay | Admitting: *Deleted

## 2014-11-21 ENCOUNTER — Telehealth: Payer: Self-pay

## 2014-11-21 NOTE — Telephone Encounter (Signed)
Patient had called Dr. Tilman NeatWalker's voicemail.  Returned call to patient.  Call was pertaining to result notes that have been mailed to patient.  Offered to review results with patient, she declined and will wait for the results to arrive in the mail.  FYI. thanks

## 2015-01-20 ENCOUNTER — Ambulatory Visit
Admission: RE | Admit: 2015-01-20 | Discharge: 2015-01-20 | Disposition: A | Discharge status: Home / Self Care | Payer: BC Managed Care – PPO | Source: Ambulatory Visit | Attending: Internal Medicine | Admitting: Internal Medicine

## 2015-01-20 DIAGNOSIS — Z0000 Encounter for general adult medical examination without abnormal findings: Secondary | ICD-10-CM

## 2015-01-20 DIAGNOSIS — Z1231 Encounter for screening mammogram for malignant neoplasm of breast: Secondary | ICD-10-CM | POA: Insufficient documentation

## 2015-10-02 ENCOUNTER — Encounter: Payer: Self-pay | Admitting: Internal Medicine

## 2015-10-29 IMAGING — CT CT STONE STUDY
2 of 4 series · 16 of 46 positions shown, 18 images · non-contrast
Comparison: None.

CLINICAL DATA: Abdominal and pelvic pain for 4 days, microscopic
hematuria

EXAM:
CT ABDOMEN AND PELVIS WITHOUT CONTRAST
TECHNIQUE: Multidetector CT imaging of the abdomen and pelvis was performed
following the standard protocol without IV contrast.

[Series 2: stone standard full · axial · 0.77mm/px · z∈[-1015,-540]mm · 13 of 103 slices shown, 15 images]
[im 4/103  soft-tissue]
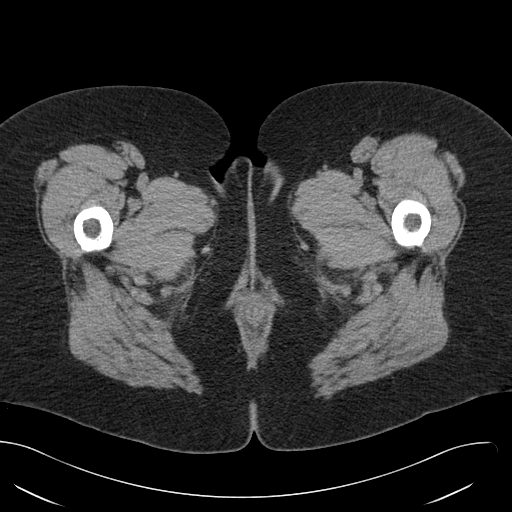
[im 4/103  bone]
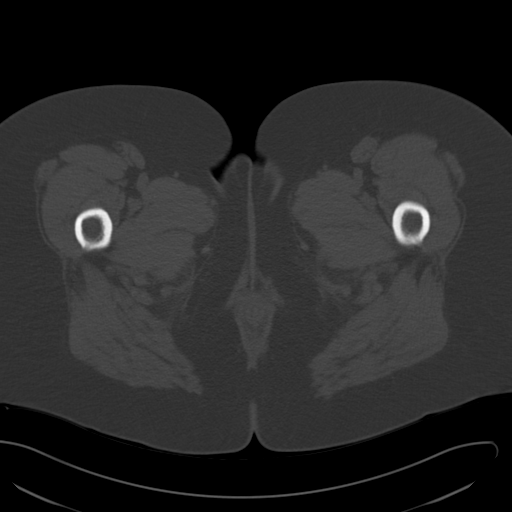
[im 12/103  soft-tissue]
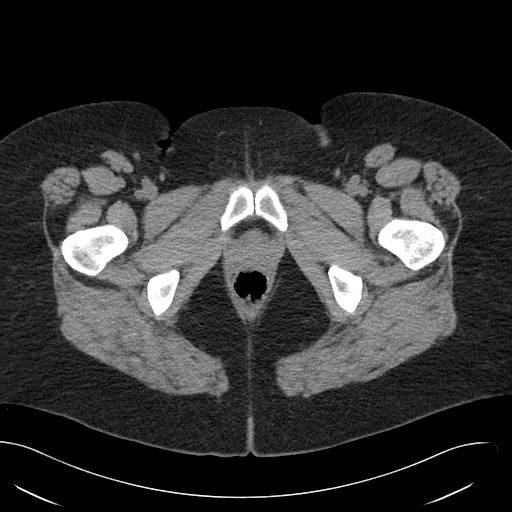
[im 20/103  soft-tissue]
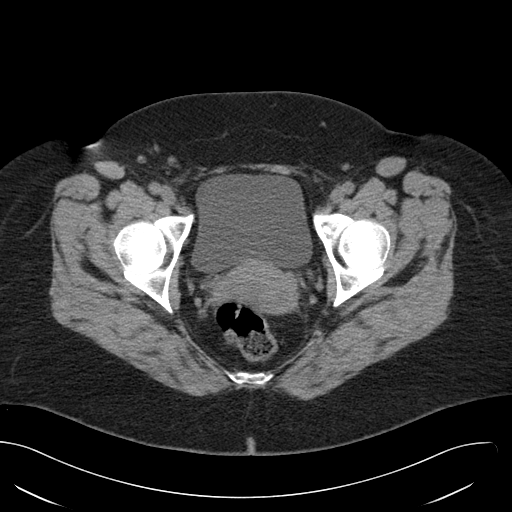
[im 28/103  soft-tissue]
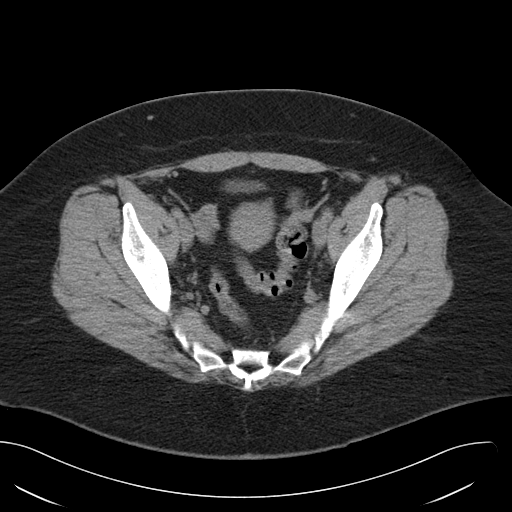
[im 36/103  soft-tissue]
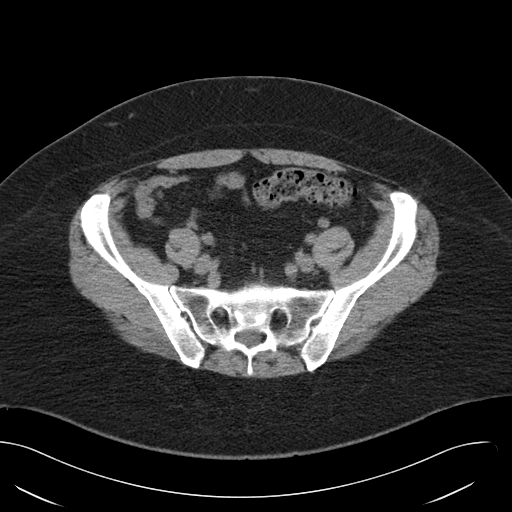
[im 44/103  soft-tissue]
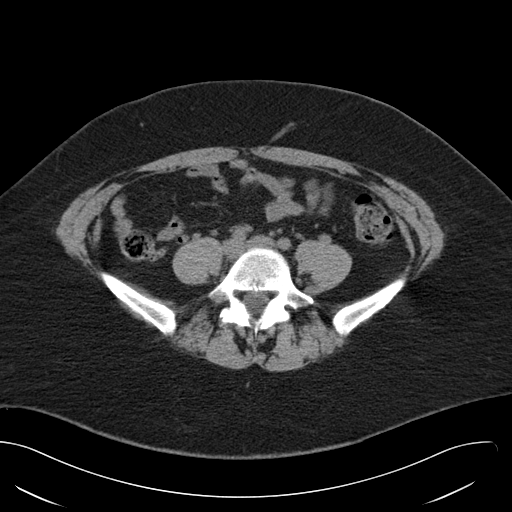
[im 52/103  soft-tissue]
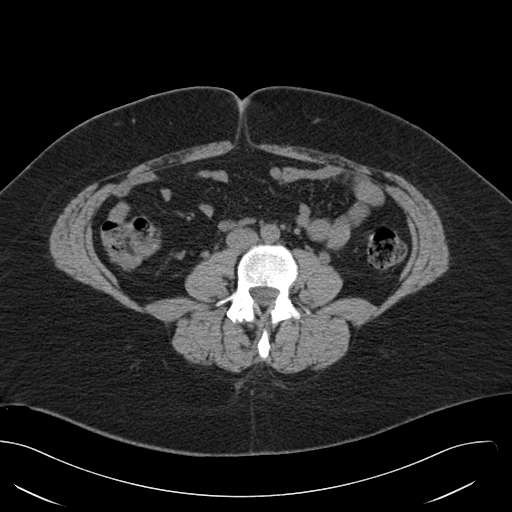
[im 59/103  soft-tissue]
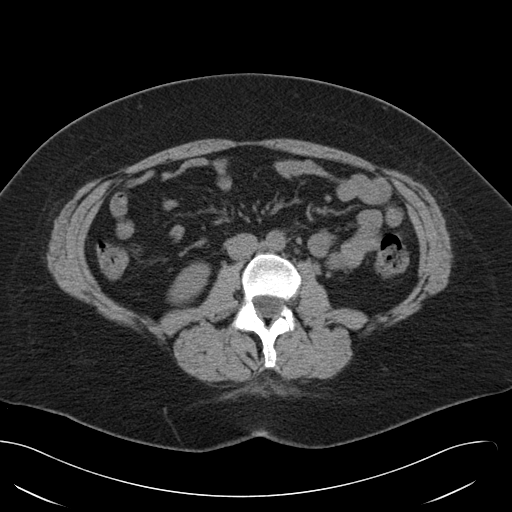
[im 67/103  soft-tissue]
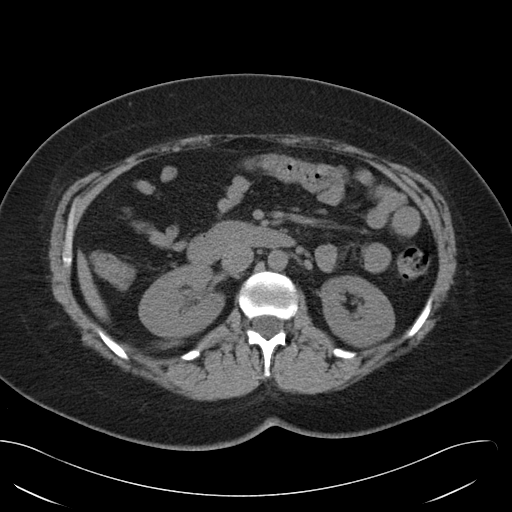
[im 67/103  bone]
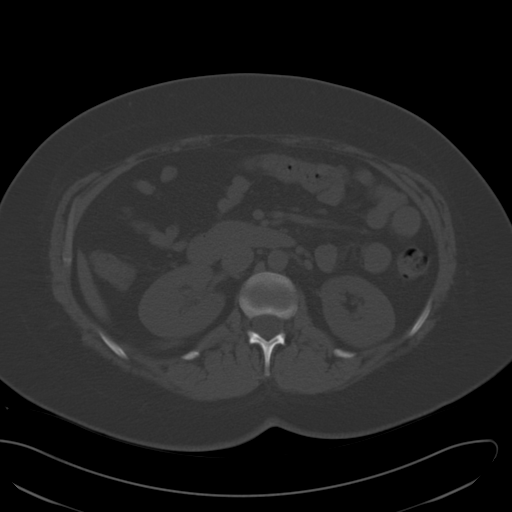
[im 75/103  soft-tissue]
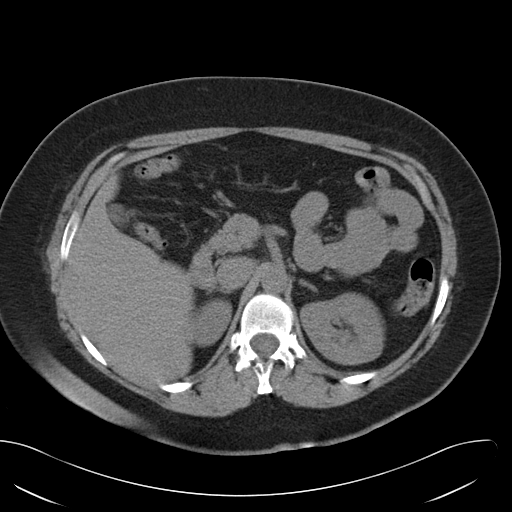
[im 83/103  soft-tissue]
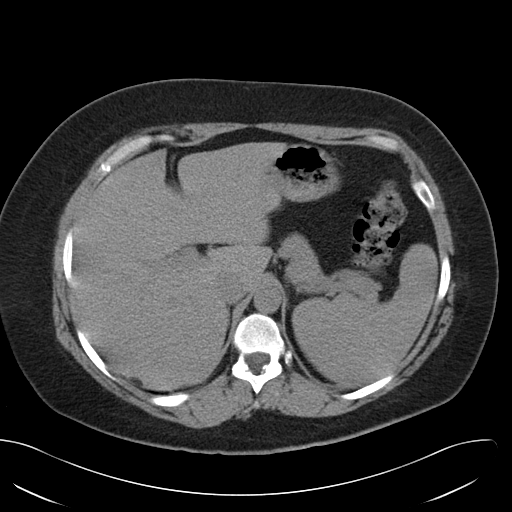
[im 91/103  soft-tissue]
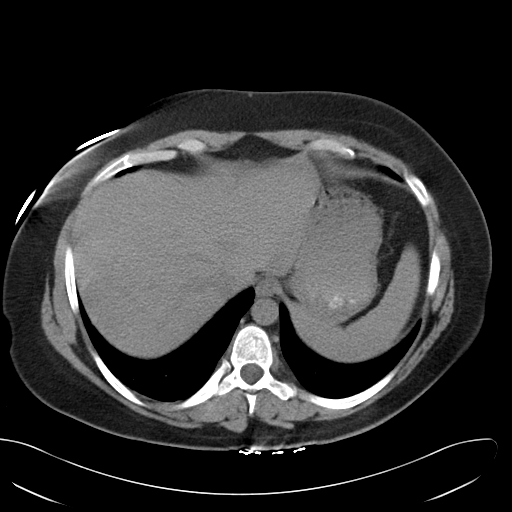
[im 99/103  soft-tissue]
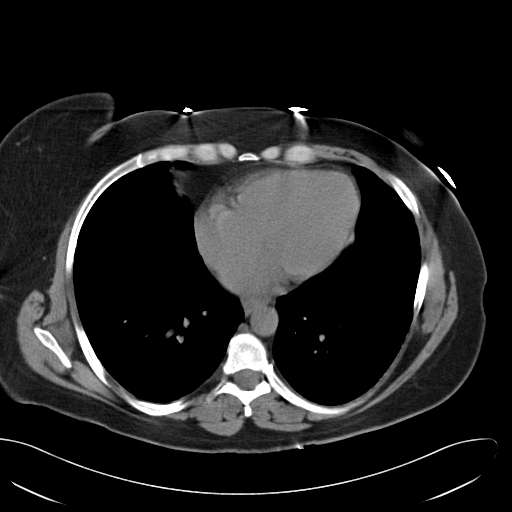

[Series 5: cor stone standard full · coronal · 0.76mm/px · 3 of 140 slices shown]
[im 47/140  soft-tissue]
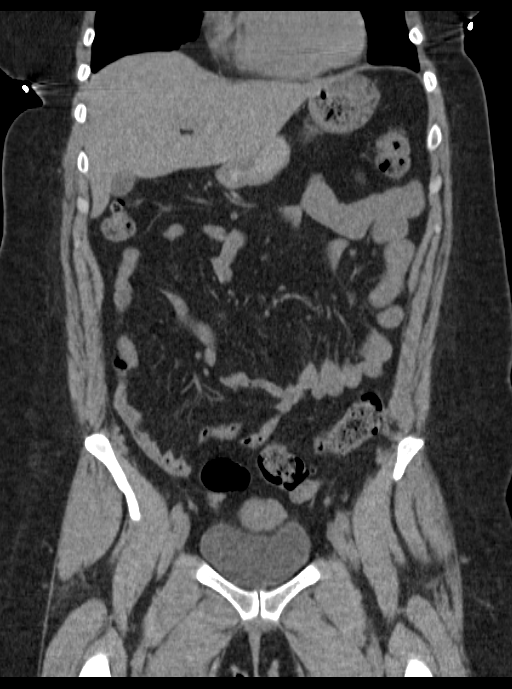
[im 62/140  soft-tissue]
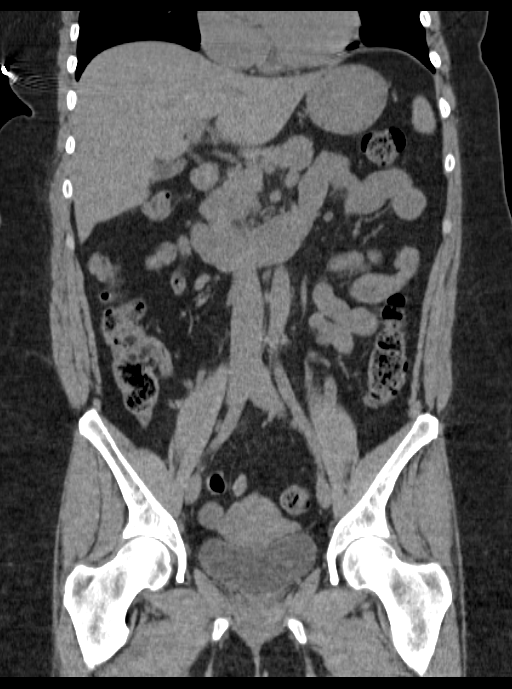
[im 78/140  soft-tissue]
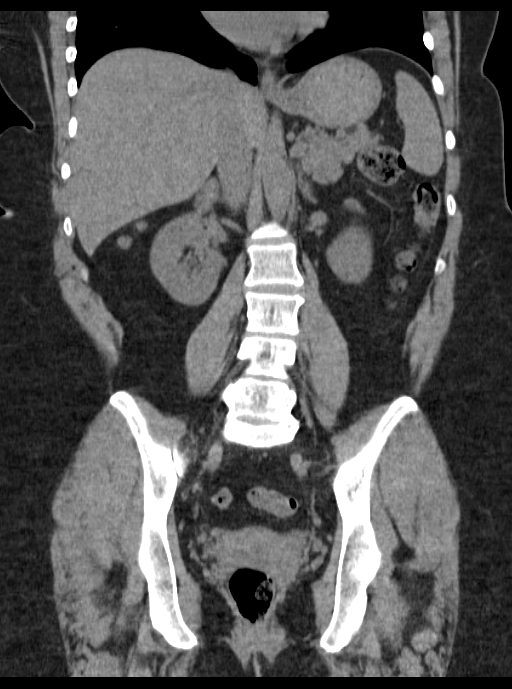

[16 of 46 positions shown; findings below may reference images not displayed]

FINDINGS: The lung bases are clear. The liver is unremarkable in the
unenhanced state other than a probable fatty lesion posteriorly in
the right lobe on axial image 21 of doubtful significance. The
gallbladder is contracted and no calcified gallstones are seen. The
pancreas is normal in size and the pancreatic duct is not dilated.
The adrenal glands and spleen are unremarkable. The stomach is
moderately distended with fluid with no abnormality noted. No renal
calculi are seen and there is no evidence of hydronephrosis or renal
lesion. The abdominal aorta is normal in caliber. No adenopathy is
seen.

There is a low-attenuation area centrally within the endometrium of
the uterus near the fundus of questionable significance possibly
representing a small amount of fluid. This low-attenuation area also
could be a sequela of uterine ablation by history. Slight lobular
contour of the fundus of the uterus to the left midline may
represent a small uterine fibroid. Small ovarian follicles are
present. No free fluid is seen within the pelvis. The urinary
bladder is unremarkable. The colon is decompressed. The terminal
ileum and the appendix are unremarkable. Degenerative disc disease
is present at L5-S1 with loss of disc space, sclerosis, and
spurring. Normal alignment of the lumbar vertebrae is noted.
IMPRESSION: 1. No explanation for the patient's abdominal or pelvic pain is
seen.
2. Question small fundal uterine fibroid. Also low-attenuation area
centrally within the uterus may be a sequela of prior uterine
ablation by history.
3. No renal or ureteral calculi are noted.
4. The appendix and terminal ileum are unremarkable.
5. Degenerative disc disease at L5-S1.

## 2015-11-27 ENCOUNTER — Ambulatory Visit (INDEPENDENT_AMBULATORY_CARE_PROVIDER_SITE_OTHER): Payer: BC Managed Care – PPO | Admitting: Internal Medicine

## 2015-11-27 ENCOUNTER — Encounter: Payer: Self-pay | Admitting: Internal Medicine

## 2015-11-27 VITALS — BP 132/90 | HR 54 | Temp 97.8°F | Ht 66.0 in | Wt 243.8 lb

## 2015-11-27 DIAGNOSIS — N959 Unspecified menopausal and perimenopausal disorder: Secondary | ICD-10-CM | POA: Diagnosis not present

## 2015-11-27 DIAGNOSIS — Z0000 Encounter for general adult medical examination without abnormal findings: Secondary | ICD-10-CM | POA: Diagnosis not present

## 2015-11-27 LAB — LIPID PANEL
CHOLESTEROL: 176 mg/dL (ref 0–200)
HDL: 63.9 mg/dL (ref >39.00)
LDL CALC: 91 mg/dL (ref 0–99)
NonHDL: 111.92
TRIGLYCERIDES: 104 mg/dL (ref 0.0–149.0)
Total CHOL/HDL Ratio: 3
VLDL: 20.8 mg/dL (ref 0.0–40.0)

## 2015-11-27 LAB — VITAMIN D 25 HYDROXY (VIT D DEFICIENCY, FRACTURES): VITD: 15.42 ng/mL — ABNORMAL LOW (ref 30.00–100.00)

## 2015-11-27 LAB — CBC WITH DIFFERENTIAL/PLATELET
BASOS PCT: 0.5 % (ref 0.0–3.0)
Basophils Absolute: 0.1 10*3/uL (ref 0.0–0.1)
EOS PCT: 1.8 % (ref 0.0–5.0)
Eosinophils Absolute: 0.2 10*3/uL (ref 0.0–0.7)
HCT: 41 % (ref 36.0–46.0)
Hemoglobin: 13.8 g/dL (ref 12.0–15.0)
LYMPHS ABS: 3.1 10*3/uL (ref 0.7–4.0)
Lymphocytes Relative: 33.4 % (ref 12.0–46.0)
MCHC: 33.7 g/dL (ref 30.0–36.0)
MCV: 81.1 fl (ref 78.0–100.0)
MONO ABS: 0.7 10*3/uL (ref 0.1–1.0)
Monocytes Relative: 7.9 % (ref 3.0–12.0)
NEUTROS ABS: 5.2 10*3/uL (ref 1.4–7.7)
NEUTROS PCT: 56.4 % (ref 43.0–77.0)
PLATELETS: 245 10*3/uL (ref 150.0–400.0)
RBC: 5.05 Mil/uL (ref 3.87–5.11)
RDW: 13.9 % (ref 11.5–15.5)
WBC: 9.2 10*3/uL (ref 4.0–10.5)

## 2015-11-27 LAB — COMPREHENSIVE METABOLIC PANEL
ALBUMIN: 4.2 g/dL (ref 3.5–5.2)
ALT: 11 U/L (ref 0–35)
AST: 15 U/L (ref 0–37)
Alkaline Phosphatase: 93 U/L (ref 39–117)
BILIRUBIN TOTAL: 0.5 mg/dL (ref 0.2–1.2)
BUN: 11 mg/dL (ref 6–23)
CALCIUM: 9.7 mg/dL (ref 8.4–10.5)
CO2: 27 mEq/L (ref 19–32)
CREATININE: 0.64 mg/dL (ref 0.40–1.20)
Chloride: 103 mEq/L (ref 96–112)
GFR: 106.6 mL/min (ref >60.00)
Glucose, Bld: 88 mg/dL (ref 70–99)
Potassium: 3.9 mEq/L (ref 3.5–5.1)
SODIUM: 136 meq/L (ref 135–145)
TOTAL PROTEIN: 7.5 g/dL (ref 6.0–8.3)

## 2015-11-27 LAB — VITAMIN B12: Vitamin B-12: 121 pg/mL — ABNORMAL LOW (ref 211–911)

## 2015-11-27 LAB — MICROALBUMIN / CREATININE URINE RATIO
CREATININE, U: 36.6 mg/dL
MICROALB UR: 0.7 mg/dL (ref 0.0–1.9)
MICROALB/CREAT RATIO: 1.9 mg/g (ref 0.0–30.0)

## 2015-11-27 LAB — TSH: TSH: 0.5 u[IU]/mL (ref 0.35–4.50)

## 2015-11-27 MED ORDER — BUPROPION HCL ER (XL) 150 MG PO TB24: 150.0000 mg | ORAL_TABLET | Freq: Every day | ORAL | Status: DC

## 2015-11-27 NOTE — Progress Notes (Signed)
Pre visit review using our clinic review tool, if applicable. No additional management support is needed unless otherwise documented below in the visit note. 

## 2015-11-27 NOTE — Assessment & Plan Note (Signed)
General medical exam normal today including breast exam. PAP and pelvic deferred per pt preference as she is menstruating. Plan repeat PAP next year. Mammogram ordered. Flu vaccine declined. Labs today. Encouraged healthy diet and exercise.

## 2015-11-27 NOTE — Assessment & Plan Note (Signed)
Menopausal symptoms with irritability noted. Will restart Wellbutrin which she has used in the past. Follow up in 4 weeks and prn.

## 2015-11-27 NOTE — Progress Notes (Signed)
Subjective:    Patient ID: Michelle Moses, female    DOB: 1971-05-03, 45 y.o.   MRN: [id number]  HPI  45YO female presents for physical exam.  Feeling well, however notes some increased anxiety and irritability recently. Would like to consider going back on Wellbutrin which she used in the past.  Wt Readings from Last 3 Encounters:  11/27/15 243 lb 12.8 oz (110.587 kg)  11/14/14 244 lb (110.678 kg)  11/08/13 241 lb (109.317 kg)   BP Readings from Last 3 Encounters:  11/27/15 132/90  11/14/14 115/69  04/04/14 126/80    Past Medical History  Diagnosis Date  . Hypertension   . Anxiety   . Positive PPD    Family History  Problem Relation Age of Onset  . Cancer Mother 62    cancer  . Hypertension Father   . Heart disease Maternal Uncle   . Breast cancer Paternal Grandmother    Past Surgical History  Procedure Laterality Date  . Laparoscopic tubal ligation      right side fallopian tube was removed  . Vaginal delivery      2  . Novasure ablation     Social History   Social History  . Marital Status: Married    Spouse Name: N/A  . Number of Children: N/A  . Years of Education: N/A   Social History Main Topics  . Smoking status: Never Smoker   . Smokeless tobacco: Never Used  . Alcohol Use: No  . Drug Use: No  . Sexual Activity: Not Asked   Other Topics Concern  . 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 No pets. Diet - regular. Exercise - none.    Review of Systems  Constitutional: Negative for fever, chills, appetite change, fatigue and unexpected weight change.  Eyes: Negative for visual disturbance.  Respiratory: Negative for cough and shortness of breath.   Cardiovascular: Negative for chest pain and leg swelling.  Gastrointestinal: Negative for nausea, vomiting, abdominal pain, diarrhea, constipation and blood in stool.  Skin: Negative for color change and  rash.  Hematological: Negative for adenopathy. Does not bruise/bleed easily.  Psychiatric/Behavioral: Positive for dysphoric mood. Negative for suicidal ideas and sleep disturbance. The patient is nervous/anxious.        Objective:    BP 132/90 mmHg  Pulse 54  Temp(Src) 97.8 F (36.6 C) (Oral)  Ht  (1.676 m)  Wt 243 lb 12.8 oz (110.587 kg)  BMI 39.37 kg/m2  SpO2 97%  LMP 11/25/2015 (Exact Date) Physical Exam  Constitutional: She is oriented to person, place, and time. She appears well-developed and well-nourished. No distress.  HENT:  Head: Normocephalic and atraumatic.  Right Ear: External ear normal.  Left Ear: External ear normal.  Nose: Nose normal.  Mouth/Throat: Oropharynx is clear and moist. No oropharyngeal exudate.  Eyes: Conjunctivae are normal. Pupils are equal, round, and reactive to light. Right eye exhibits no discharge. Left eye exhibits no discharge. No scleral icterus.  Neck: Normal range of motion. Neck supple. No tracheal deviation present. No thyromegaly present.  Cardiovascular: Normal rate, regular rhythm, normal heart sounds and intact distal pulses.  Exam reveals no gallop and no friction rub.   No murmur heard. Pulmonary/Chest: Effort normal and breath sounds normal. No accessory muscle usage. No tachypnea. No respiratory distress. She has no decreased breath sounds. She has no wheezes. She has no rales. She exhibits no  tenderness. Right breast exhibits no inverted nipple, no mass, no nipple discharge, no skin change and no tenderness. Left breast exhibits no inverted nipple, no mass, no nipple discharge, no skin change and no tenderness. Breasts are symmetrical.  Abdominal: Soft. Bowel sounds are normal. She exhibits no distension and no mass. There is no tenderness. There is no rebound and no guarding.  Musculoskeletal: Normal range of motion. She exhibits no edema or tenderness.  Lymphadenopathy:    She has no cervical adenopathy.  Neurological: She  is alert and oriented to person, place, and time. No cranial nerve deficit. She exhibits normal muscle tone. Coordination normal.  Skin: Skin is warm and dry. No rash noted. She is not diaphoretic. No erythema. No pallor.  Psychiatric: She has a normal mood and affect. Her behavior is normal. Judgment and thought content normal.          Assessment & Plan:   Problem List Items Addressed This Visit      Unprioritized   Menopausal disorder    Menopausal symptoms with irritability noted. Will restart Wellbutrin which she has used in the past. Follow up in 4 weeks and prn.      Relevant Medications   buPROPion (WELLBUTRIN XL) 150 MG 24 hr tablet   Routine general medical examination at a health care facility - Primary    General medical exam normal today including breast exam. PAP and pelvic deferred per pt preference as she is menstruating. Plan repeat PAP next year. Mammogram ordered. Flu vaccine declined. Labs today. Encouraged healthy diet and exercise.      Relevant Orders   CBC with Differential/Platelet   Comprehensive metabolic panel   Lipid panel   Microalbumin / creatinine urine ratio   VITAMIN D 25 Hydroxy (Vit-D Deficiency, Fractures)   TSH   B12   MM Digital Screening       Return in about 4 weeks (around 12/25/2015) for Recheck.  Ronna PolioJennifer Walker, MD Internal Medicine [facility]

## 2015-11-27 NOTE — Patient Instructions (Signed)
Health Maintenance, Female Adopting a healthy lifestyle and getting preventive care can go a long way to promote health and wellness. Talk with your health care provider about what schedule of regular examinations is right for you. This is a good chance for you to check in with your provider about disease prevention and staying healthy. In between checkups, there are plenty of things you can do on your own. Experts have done a lot of research about which lifestyle changes and preventive measures are most likely to keep you healthy. Ask your health care provider for more information. WEIGHT AND DIET  Eat a healthy diet  Be sure to include plenty of vegetables, fruits, low-fat dairy products, and lean protein.  Do not eat a lot of foods high in solid fats, added sugars, or salt.  Get regular exercise. This is one of the most important things you can do for your health.  Most adults should exercise for at least 150 minutes each week. The exercise should increase your heart rate and make you sweat (moderate-intensity exercise).  Most adults should also do strengthening exercises at least twice a week. This is in addition to the moderate-intensity exercise.  Maintain a healthy weight  Body mass index (BMI) is a measurement that can be used to identify possible weight problems. It estimates body fat based on height and weight. Your health care provider can help determine your BMI and help you achieve or maintain a healthy weight.  For females 20 years of age and older:   A BMI below 18.5 is considered underweight.  A BMI of 18.5 to 24.9 is normal.  A BMI of 25 to 29.9 is considered overweight.  A BMI of 30 and above is considered obese.  Watch levels of cholesterol and blood lipids  You should start having your blood tested for lipids and cholesterol at 45 years of age, then have this test every 5 years.  You may need to have your cholesterol levels checked more often if:  Your lipid  or cholesterol levels are high.  You are older than 45 years of age.  You are at high risk for heart disease.  CANCER SCREENING   Lung Cancer  Lung cancer screening is recommended for adults 55-80 years old who are at high risk for lung cancer because of a history of smoking.  A yearly low-dose CT scan of the lungs is recommended for people who:  Currently smoke.  Have quit within the past 15 years.  Have at least a 30-pack-year history of smoking. A pack year is smoking an average of one pack of cigarettes a day for 1 year.  Yearly screening should continue until it has been 15 years since you quit.  Yearly screening should stop if you develop a health problem that would prevent you from having lung cancer treatment.  Breast Cancer  Practice breast self-awareness. This means understanding how your breasts normally appear and feel.  It also means doing regular breast self-exams. Let your health care provider know about any changes, no matter how small.  If you are in your 20s or 30s, you should have a clinical breast exam (CBE) by a health care provider every 1-3 years as part of a regular health exam.  If you are 40 or older, have a CBE every year. Also consider having a breast X-ray (mammogram) every year.  If you have a family history of breast cancer, talk to your health care provider about genetic screening.  If you   are at high risk for breast cancer, talk to your health care provider about having an MRI and a mammogram every year.  Breast cancer gene (BRCA) assessment is recommended for women who have family members with BRCA-related cancers. BRCA-related cancers include:  Breast.  Ovarian.  Tubal.  Peritoneal cancers.  Results of the assessment will determine the need for genetic counseling and BRCA1 and BRCA2 testing. Cervical Cancer Your health care provider may recommend that you be screened regularly for cancer of the pelvic organs (ovaries, uterus, and  vagina). This screening involves a pelvic examination, including checking for microscopic changes to the surface of your cervix (Pap test). You may be encouraged to have this screening done every 3 years, beginning at age 21.  For women ages 30-65, health care providers may recommend pelvic exams and Pap testing every 3 years, or they may recommend the Pap and pelvic exam, combined with testing for human papilloma virus (HPV), every 5 years. Some types of HPV increase your risk of cervical cancer. Testing for HPV may also be done on women of any age with unclear Pap test results.  Other health care providers may not recommend any screening for nonpregnant women who are considered low risk for pelvic cancer and who do not have symptoms. Ask your health care provider if a screening pelvic exam is right for you.  If you have had past treatment for cervical cancer or a condition that could lead to cancer, you need Pap tests and screening for cancer for at least 20 years after your treatment. If Pap tests have been discontinued, your risk factors (such as having a new sexual partner) need to be reassessed to determine if screening should resume. Some women have medical problems that increase the chance of getting cervical cancer. In these cases, your health care provider may recommend more frequent screening and Pap tests. Colorectal Cancer  This type of cancer can be detected and often prevented.  Routine colorectal cancer screening usually begins at 45 years of age and continues through 45 years of age.  Your health care provider may recommend screening at an earlier age if you have risk factors for colon cancer.  Your health care provider may also recommend using home test kits to check for hidden blood in the stool.  A small camera at the end of a tube can be used to examine your colon directly (sigmoidoscopy or colonoscopy). This is done to check for the earliest forms of colorectal  cancer.  Routine screening usually begins at age 50.  Direct examination of the colon should be repeated every 5-10 years through 45 years of age. However, you may need to be screened more often if early forms of precancerous polyps or small growths are found. Skin Cancer  Check your skin from head to toe regularly.  Tell your health care provider about any new moles or changes in moles, especially if there is a change in a mole's shape or color.  Also tell your health care provider if you have a mole that is larger than the size of a pencil eraser.  Always use sunscreen. Apply sunscreen liberally and repeatedly throughout the day.  Protect yourself by wearing long sleeves, pants, a wide-brimmed hat, and sunglasses whenever you are outside. HEART DISEASE, DIABETES, AND HIGH BLOOD PRESSURE   High blood pressure causes heart disease and increases the risk of stroke. High blood pressure is more likely to develop in:  People who have blood pressure in the high end   of the normal range (130-139/85-89 mm Hg).  People who are overweight or obese.  People who are African American.  If you are 38-23 years of age, have your blood pressure checked every 3-5 years. If you are 61 years of age or older, have your blood pressure checked every year. You should have your blood pressure measured twice--once when you are at a hospital or clinic, and once when you are not at a hospital or clinic. Record the average of the two measurements. To check your blood pressure when you are not at a hospital or clinic, you can use:  An automated blood pressure machine at a pharmacy.  A home blood pressure monitor.  If you are between 45 years and 39 years old, ask your health care provider if you should take aspirin to prevent strokes.  Have regular diabetes screenings. This involves taking a blood sample to check your fasting blood sugar level.  If you are at a normal weight and have a low risk for diabetes,  have this test once every three years after 45 years of age.  If you are overweight and have a high risk for diabetes, consider being tested at a younger age or more often. PREVENTING INFECTION  Hepatitis B  If you have a higher risk for hepatitis B, you should be screened for this virus. You are considered at high risk for hepatitis B if:  You were born in a country where hepatitis B is common. Ask your health care provider which countries are considered high risk.  Your parents were born in a high-risk country, and you have not been immunized against hepatitis B (hepatitis B vaccine).  You have HIV or AIDS.  You use needles to inject street drugs.  You live with someone who has hepatitis B.  You have had sex with someone who has hepatitis B.  You get hemodialysis treatment.  You take certain medicines for conditions, including cancer, organ transplantation, and autoimmune conditions. Hepatitis C  Blood testing is recommended for:  Everyone born from [birth year] through 1965.  Anyone with known risk factors for hepatitis C. Sexually transmitted infections (STIs)  You should be screened for sexually transmitted infections (STIs) including gonorrhea and chlamydia if:  You are sexually active and are younger than 45 years of age.  You are older than 45 years of age and your health care provider tells you that you are at risk for this type of infection.  Your sexual activity has changed since you were last screened and you are at an increased risk for chlamydia or gonorrhea. Ask your health care provider if you are at risk.  If you do not have HIV, but are at risk, it may be recommended that you take a prescription medicine daily to prevent HIV infection. This is called pre-exposure prophylaxis (PrEP). You are considered at risk if:  You are sexually active and do not regularly use condoms or know the HIV status of your partner(s).  You take drugs by injection.  You are sexually  active with a partner who has HIV. Talk with your health care provider about whether you are at high risk of being infected with HIV. If you choose to begin PrEP, you should first be tested for HIV. You should then be tested every 3 months for as long as you are taking PrEP.  PREGNANCY   If you are premenopausal and you may become pregnant, ask your health care provider about preconception counseling.  If you may  become pregnant, take 400 to 800 micrograms (mcg) of folic acid every day.  If you want to prevent pregnancy, talk to your health care provider about birth control (contraception). OSTEOPOROSIS AND MENOPAUSE   Osteoporosis is a disease in which the bones lose minerals and strength with aging. This can result in serious bone fractures. Your risk for osteoporosis can be identified using a bone density scan.  If you are 61 years of age or older, or if you are at risk for osteoporosis and fractures, ask your health care provider if you should be screened.  Ask your health care provider whether you should take a calcium or vitamin D supplement to lower your risk for osteoporosis.  Menopause may have certain physical symptoms and risks.  Hormone replacement therapy may reduce some of these symptoms and risks. Talk to your health care provider about whether hormone replacement therapy is right for you.  HOME CARE INSTRUCTIONS   Schedule regular health, dental, and eye exams.  Stay current with your immunizations.   Do not use any tobacco products including cigarettes, chewing tobacco, or electronic cigarettes.  If you are pregnant, do not drink alcohol.  If you are breastfeeding, limit how much and how often you drink alcohol.  Limit alcohol intake to no more than 1 drink per day for nonpregnant women. One drink equals 12 ounces of beer, 5 ounces of wine, or 1 ounces of hard liquor.  Do not use street drugs.  Do not share needles.  Ask your health care provider for help if  you need support or information about quitting drugs.  Tell your health care provider if you often feel depressed.  Tell your health care provider if you have ever been abused or do not feel safe at home.   This information is not intended to replace advice given to you by your health care provider. Make sure you discuss any questions you have with your health care provider.   Document Released: 01/31/2011 Document Revised: 08/08/2014 Document Reviewed: 06/19/2013 Elsevier Interactive Patient Education [employer].

## 2015-12-07 ENCOUNTER — Encounter: Payer: Self-pay | Admitting: Internal Medicine

## 2016-01-07 ENCOUNTER — Encounter: Payer: Self-pay | Admitting: Internal Medicine

## 2016-01-07 ENCOUNTER — Other Ambulatory Visit (HOSPITAL_COMMUNITY)
Admission: RE | Admit: 2016-01-07 | Discharge: 2016-01-07 | Disposition: A | Discharge status: Home / Self Care | Payer: BC Managed Care – PPO | Source: Ambulatory Visit | Attending: Internal Medicine | Admitting: Internal Medicine

## 2016-01-07 ENCOUNTER — Ambulatory Visit (INDEPENDENT_AMBULATORY_CARE_PROVIDER_SITE_OTHER): Payer: BC Managed Care – PPO | Admitting: Internal Medicine

## 2016-01-07 VITALS — BP 128/82 | HR 62 | Ht 67.0 in | Wt 239.0 lb

## 2016-01-07 DIAGNOSIS — E538 Deficiency of other specified B group vitamins: Secondary | ICD-10-CM

## 2016-01-07 DIAGNOSIS — N959 Unspecified menopausal and perimenopausal disorder: Secondary | ICD-10-CM | POA: Diagnosis not present

## 2016-01-07 DIAGNOSIS — Z1151 Encounter for screening for human papillomavirus (HPV): Secondary | ICD-10-CM | POA: Insufficient documentation

## 2016-01-07 DIAGNOSIS — Z124 Encounter for screening for malignant neoplasm of cervix: Secondary | ICD-10-CM

## 2016-01-07 DIAGNOSIS — Z01411 Encounter for gynecological examination (general) (routine) with abnormal findings: Secondary | ICD-10-CM | POA: Diagnosis present

## 2016-01-07 DIAGNOSIS — I10 Essential (primary) hypertension: Secondary | ICD-10-CM

## 2016-01-07 MED ORDER — CYANOCOBALAMIN 1000 MCG/ML IJ SOLN
1000.0000 ug | Freq: Once | INTRAMUSCULAR | Status: AC
  Administered 2016-01-07: 1000 ug via INTRAMUSCULAR

## 2016-01-07 MED ORDER — BISOPROLOL-HYDROCHLOROTHIAZIDE 5-6.25 MG PO TABS: 1.0000 | ORAL_TABLET | Freq: Every day | ORAL | Status: AC

## 2016-01-07 MED ORDER — CYANOCOBALAMIN 1000 MCG/ML IJ SOLN: 1000.0000 ug | INTRAMUSCULAR | Status: AC

## 2016-01-07 MED ORDER — BUPROPION HCL ER (XL) 300 MG PO TB24: 300.0000 mg | ORAL_TABLET | Freq: Every day | ORAL | Status: AC

## 2016-01-07 NOTE — Assessment & Plan Note (Signed)
B12 deficiency noted on labs. Will start montly B12 injections.

## 2016-01-07 NOTE — Assessment & Plan Note (Signed)
Symptoms improved with Wellbutrin, but not completely controlled. Will increase Wellbutrin to 300mg  daily. Follow up in 3 months and prn.

## 2016-01-07 NOTE — Assessment & Plan Note (Signed)
BP Readings from Last 3 Encounters:  01/07/16 128/82  11/27/15 132/90  11/14/14 115/69   BP well controlled. Continue current medication.

## 2016-01-07 NOTE — Patient Instructions (Addendum)
Increase Wellbutrin to 300mg  daily.  Start B12 injections monthly.  Follow up in 3 months or sooner as needed.

## 2016-01-07 NOTE — Addendum Note (Signed)
Addended by: Warden FillersWRIGHT, Timmya Blazier S on: 01/07/2016 02:21 PM   Modules accepted: Orders

## 2016-01-07 NOTE — Progress Notes (Signed)
Pre visit review using our clinic review tool, if applicable. No additional management support is needed unless otherwise documented below in the visit note. 

## 2016-01-07 NOTE — Progress Notes (Signed)
Subjective:    Patient ID: Michelle Moses, female    DOB: 1970-08-13, 45 y.o.   MRN: [id number]  HPI  45YO female presents for follow up.  Recently seen with worsening menopausal symptoms. Started on Wellbutrin. Notes improvement in anxiety on medication, however sometimes effects seem to wear off. Also initially noted some improvement in appetite.   Wt Readings from Last 3 Encounters:  01/07/16 239 lb (108.41 kg)  11/27/15 243 lb 12.8 oz (110.587 kg)  11/14/14 244 lb (110.678 kg)   BP Readings from Last 3 Encounters:  01/07/16 128/82  11/27/15 132/90  11/14/14 115/69    Past Medical History  Diagnosis Date  . Hypertension   . Anxiety   . Positive PPD    Family History  Problem Relation Age of Onset  . Cancer Mother 30    cancer  . Hypertension Father   . Heart disease Maternal Uncle   . Breast cancer Paternal Grandmother    Past Surgical History  Procedure Laterality Date  . Laparoscopic tubal ligation      right side fallopian tube was removed  . Vaginal delivery      2  . Novasure ablation     Social History   Social History  . Marital Status: Married    Spouse Name: N/A  . Number of Children: N/A  . Years of Education: N/A   Social History Main Topics  . Smoking status: Never Smoker   . Smokeless tobacco: Never Used  . Alcohol Use: No  . Drug Use: No  . Sexual Activity: Not Asked   Other Topics Concern  . 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 No pets. Diet - regular. Exercise - none.    Review of Systems  Constitutional: Negative for fever, chills, appetite change, fatigue and unexpected weight change.  Eyes: Negative for visual disturbance.  Respiratory: Negative for shortness of breath.   Cardiovascular: Negative for chest pain and leg swelling.  Gastrointestinal: Negative for abdominal pain.  Genitourinary: Negative for vaginal bleeding, vaginal  discharge, vaginal pain and menstrual problem.  Skin: Negative for color change and rash.  Hematological: Negative for adenopathy. Does not bruise/bleed easily.  Psychiatric/Behavioral: Positive for sleep disturbance. Negative for dysphoric mood. The patient is nervous/anxious.        Objective:    BP 128/82 mmHg  Pulse 62  Ht  (1.702 m)  Wt 239 lb (108.41 kg)  BMI 37.42 kg/m2  SpO2 99% Physical Exam  Constitutional: She is oriented to person, place, and time. She appears well-developed and well-nourished. No distress.  HENT:  Head: Normocephalic and atraumatic.  Right Ear: External ear normal.  Left Ear: External ear normal.  Nose: Nose normal.  Mouth/Throat: Oropharynx is clear and moist. No oropharyngeal exudate.  Eyes: Conjunctivae are normal. Pupils are equal, round, and reactive to light. Right eye exhibits no discharge. Left eye exhibits no discharge. No scleral icterus.  Neck: Normal range of motion. Neck supple. No tracheal deviation present. No thyromegaly present.  Cardiovascular: Normal rate, regular rhythm, normal heart sounds and intact distal pulses.  Exam reveals no gallop and no friction rub.   No murmur heard. Pulmonary/Chest: Effort normal and breath sounds normal. No respiratory distress. She has no wheezes. She has no rales. She exhibits no tenderness.  Genitourinary: Vagina normal and uterus normal. There is rash, tenderness, lesion and injury on the right labia. There  is rash, tenderness, lesion and injury on the left labia. Cervix exhibits no motion tenderness, no discharge and no friability. Right adnexum displays no mass, no tenderness and no fullness. Left adnexum displays no mass, no tenderness and no fullness.  Musculoskeletal: Normal range of motion. She exhibits no edema or tenderness.  Lymphadenopathy:    She has no cervical adenopathy.  Neurological: She is alert and oriented to person, place, and time. No cranial nerve deficit. She exhibits  normal muscle tone. Coordination normal.  Skin: Skin is warm and dry. No rash noted. She is not diaphoretic. No erythema. No pallor.  Psychiatric: She has a normal mood and affect. Her behavior is normal. Judgment and thought content normal.          Assessment & Plan:   Problem List Items Addressed This Visit      Unprioritized   B12 deficiency    B12 deficiency noted on labs. Will start montly B12 injections.      Hypertension    BP Readings from Last 3 Encounters:  01/07/16 128/82  11/27/15 132/90  11/14/14 115/69   BP well controlled. Continue current medication.      Relevant Medications   bisoprolol-hydrochlorothiazide (ZIAC) 5-6.25 MG tablet   Menopausal disorder - Primary    Symptoms improved with Wellbutrin, but not completely controlled. Will increase Wellbutrin to 300mg  daily. Follow up in 3 months and prn.      Relevant Medications   buPROPion (WELLBUTRIN XL) 300 MG 24 hr tablet   Screening for cervical cancer    PAP completed today.      Relevant Orders   Cytology - PAP       Return in about 3 months (around 04/08/2016) for Recheck.  Ronna PolioJennifer Walker, MD Internal Medicine [facility]

## 2016-01-07 NOTE — Assessment & Plan Note (Signed)
PAP completed today 

## 2016-01-08 LAB — CYTOLOGY - PAP

## 2016-01-25 ENCOUNTER — Ambulatory Visit: Payer: BC Managed Care – PPO

## 2016-02-08 ENCOUNTER — Ambulatory Visit
Admission: RE | Admit: 2016-02-08 | Discharge: 2016-02-08 | Disposition: A | Discharge status: Home / Self Care | Payer: BC Managed Care – PPO | Source: Ambulatory Visit | Attending: Internal Medicine | Admitting: Internal Medicine

## 2016-02-08 DIAGNOSIS — Z0000 Encounter for general adult medical examination without abnormal findings: Secondary | ICD-10-CM | POA: Insufficient documentation

## 2016-02-08 DIAGNOSIS — Z1231 Encounter for screening mammogram for malignant neoplasm of breast: Secondary | ICD-10-CM | POA: Insufficient documentation

## 2016-04-15 ENCOUNTER — Ambulatory Visit: Payer: BC Managed Care – PPO | Admitting: Internal Medicine

## 2017-01-16 ENCOUNTER — Other Ambulatory Visit: Payer: Self-pay | Admitting: Physician Assistant

## 2017-01-16 DIAGNOSIS — Z1231 Encounter for screening mammogram for malignant neoplasm of breast: Secondary | ICD-10-CM

## 2017-03-27 ENCOUNTER — Ambulatory Visit
Admission: RE | Admit: 2017-03-27 | Discharge: 2017-03-27 | Disposition: A | Discharge status: Home / Self Care | Payer: BC Managed Care – PPO | Source: Ambulatory Visit | Attending: Internal Medicine | Admitting: Internal Medicine

## 2017-03-27 DIAGNOSIS — Z1231 Encounter for screening mammogram for malignant neoplasm of breast: Secondary | ICD-10-CM | POA: Diagnosis present
